# Patient Record
Sex: Female | Born: 2000 | Race: Black or African American | Hispanic: No | Marital: Single | Smoking: Never smoker
Health system: Southern US, Community
[De-identification: ages and names within clinical notes are randomized; demographics above are authoritative.]

## PROBLEM LIST (undated history)

## (undated) DIAGNOSIS — L309 Dermatitis, unspecified: Secondary | ICD-10-CM

## (undated) DIAGNOSIS — J45909 Unspecified asthma, uncomplicated: Secondary | ICD-10-CM

---
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
## 2019-11-28 ENCOUNTER — Emergency Department (HOSPITAL_BASED_OUTPATIENT_CLINIC_OR_DEPARTMENT_OTHER)
Admission: EM | Admit: 2019-11-28 | Discharge: 2019-11-28 | Disposition: A | Discharge status: Home / Self Care | Payer: Managed Care, Other (non HMO) | Attending: Emergency Medicine | Admitting: Emergency Medicine

## 2019-11-28 ENCOUNTER — Encounter (HOSPITAL_BASED_OUTPATIENT_CLINIC_OR_DEPARTMENT_OTHER): Payer: Self-pay | Admitting: Emergency Medicine

## 2019-11-28 ENCOUNTER — Other Ambulatory Visit: Payer: Self-pay

## 2019-11-28 DIAGNOSIS — N72 Inflammatory disease of cervix uteri: Secondary | ICD-10-CM | POA: Insufficient documentation

## 2019-11-28 DIAGNOSIS — J45909 Unspecified asthma, uncomplicated: Secondary | ICD-10-CM | POA: Insufficient documentation

## 2019-11-28 DIAGNOSIS — B9689 Other specified bacterial agents as the cause of diseases classified elsewhere: Secondary | ICD-10-CM

## 2019-11-28 DIAGNOSIS — N760 Acute vaginitis: Secondary | ICD-10-CM | POA: Insufficient documentation

## 2019-11-28 LAB — URINALYSIS, MICROSCOPIC (REFLEX): RBC / HPF: NONE SEEN RBC/hpf (ref 0–5)

## 2019-11-28 LAB — URINALYSIS, ROUTINE W REFLEX MICROSCOPIC
Bilirubin Urine: NEGATIVE
Glucose, UA: NEGATIVE mg/dL
Hgb urine dipstick: NEGATIVE
Ketones, ur: NEGATIVE mg/dL
Nitrite: NEGATIVE
Protein, ur: NEGATIVE mg/dL
Specific Gravity, Urine: 1.025 (ref 1.005–1.030)
pH: 6 (ref 5.0–8.0)

## 2019-11-28 LAB — WET PREP, GENITAL
Sperm: NONE SEEN
Trich, Wet Prep: NONE SEEN

## 2019-11-28 LAB — PREGNANCY, URINE: Preg Test, Ur: NEGATIVE

## 2019-11-28 MED ORDER — CEFTRIAXONE SODIUM 500 MG IJ SOLR: 250.0000 mg | Freq: Once | INTRAMUSCULAR | Status: DC

## 2019-11-28 MED ORDER — DOXYCYCLINE HYCLATE 100 MG PO TABS
100.0000 mg | ORAL_TABLET | Freq: Once | ORAL | Status: AC
  Administered 2019-11-28: 100 mg via ORAL
  Filled 2019-11-28: qty 1

## 2019-11-28 MED ORDER — METRONIDAZOLE 500 MG PO TABS: 500.0000 mg | ORAL_TABLET | Freq: Two times a day (BID) | ORAL | 0 refills | Status: AC

## 2019-11-28 MED ORDER — CEFTRIAXONE SODIUM 500 MG IJ SOLR
500.0000 mg | Freq: Once | INTRAMUSCULAR | Status: AC
  Administered 2019-11-28: 500 mg via INTRAMUSCULAR

## 2019-11-28 MED ORDER — CEFTRIAXONE SODIUM 500 MG IJ SOLR
INTRAMUSCULAR | Status: AC
  Filled 2019-11-28: qty 500

## 2019-11-28 MED ORDER — DOXYCYCLINE HYCLATE 100 MG PO CAPS: 100.0000 mg | ORAL_CAPSULE | Freq: Two times a day (BID) | ORAL | 0 refills | Status: AC

## 2019-11-28 NOTE — ED Triage Notes (Signed)
Reports vaginal discharge , itching, dysuria and hematuria x 4 days. Lower abd pain. Denies nV.

## 2019-11-28 NOTE — ED Provider Notes (Signed)
[facility] EMERGENCY DEPARTMENT Provider Note   CSN: [id number] Arrival date & time: 11/28/19  2229     History Chief Complaint  Patient presents with  . Vaginal Discharge  . Dysuria    Sylvia Olson is a 19 y.o. female.   Vaginal Discharge Quality:  Thick and white Severity:  Moderate Onset quality:  Gradual Timing:  Constant Progression:  Worsening Chronicity:  New Relieved by:  Nothing Worsened by:  Nothing Ineffective treatments:  None tried Associated symptoms: dysuria   Associated symptoms: no fever, no nausea, no urinary hesitancy, no urinary incontinence and no vomiting   Dysuria Associated symptoms: vaginal discharge   Associated symptoms: no fever, no nausea and no vomiting        Past Medical History:  Diagnosis Date  . Asthma   . Eczema     There are no problems to display for this patient.   History reviewed. No pertinent surgical history.   OB History   No obstetric history on file.     No family history on file.  Social History   Tobacco Use  . Smoking status: Never Smoker  Substance Use Topics  . Alcohol use: No  . Drug use: Not on file    Home Medications Prior to Admission medications   Medication Sig Start Date End Date Taking? Authorizing Provider  doxycycline (VIBRAMYCIN) 100 MG capsule Take 1 capsule (100 mg total) by mouth 2 (two) times daily for 7 days. 11/28/19 12/05/19  Sabino Donovan, MD  metroNIDAZOLE (FLAGYL) 500 MG tablet Take 1 tablet (500 mg total) by mouth 2 (two) times daily for 7 days. 11/28/19 12/05/19  Sabino Donovan, MD    Allergies    Patient has no known allergies.  Review of Systems   Review of Systems  Constitutional: Negative for chills and fever.  HENT: Negative for congestion and rhinorrhea.   Respiratory: Negative for cough and shortness of breath.   Cardiovascular: Negative for chest pain and palpitations.  Gastrointestinal: Negative for diarrhea, nausea and vomiting.    Genitourinary: Positive for dysuria, pelvic pain and vaginal discharge. Negative for bladder incontinence, difficulty urinating and hesitancy.  Musculoskeletal: Negative for arthralgias and back pain.  Skin: Negative for rash and wound.  Neurological: Negative for light-headedness and headaches.    Physical Exam Updated Vital Signs BP 132/84   Pulse 84   Temp 98.7 F (37.1 C) (Oral)   Resp 16   Ht 5\' 6"  (1.676 m)   Wt 78.9 kg   LMP 11/13/2019   SpO2 100%   BMI 28.08 kg/m   Physical Exam Vitals and nursing note reviewed. Exam conducted with a chaperone present.  Constitutional:      General: She is not in acute distress.    Appearance: Normal appearance.  HENT:     Head: Normocephalic and atraumatic.     Nose: No rhinorrhea.  Eyes:     General:        Right eye: No discharge.        Left eye: No discharge.     Conjunctiva/sclera: Conjunctivae normal.  Cardiovascular:     Rate and Rhythm: Normal rate and regular rhythm.  Pulmonary:     Effort: Pulmonary effort is normal. No respiratory distress.     Breath sounds: No stridor.  Abdominal:     General: Abdomen is flat. There is no distension.     Palpations: Abdomen is soft.     Tenderness: There is no abdominal tenderness.  Genitourinary:    Labia:        Right: No rash or tenderness.        Left: No rash or tenderness.      Vagina: Vaginal discharge present. No tenderness.     Cervix: Discharge, friability and erythema present. No cervical motion tenderness.  Musculoskeletal:        General: No tenderness or signs of injury.  Skin:    General: Skin is warm and dry.  Neurological:     General: No focal deficit present.     Mental Status: She is alert. Mental status is at baseline.     Motor: No weakness.  Psychiatric:        Mood and Affect: Mood normal.        Behavior: Behavior normal.     ED Results / Procedures / Treatments   Labs (all labs ordered are listed, but only abnormal results are  displayed) Labs Reviewed  WET PREP, GENITAL - Abnormal; Notable for the following components:      Result Value   Yeast Wet Prep HPF POC PRESENT (*)    Clue Cells Wet Prep HPF POC PRESENT (*)    WBC, Wet Prep HPF POC MANY (*)    All other components within normal limits  URINALYSIS, ROUTINE W REFLEX MICROSCOPIC - Abnormal; Notable for the following components:   Leukocytes,Ua TRACE (*)    All other components within normal limits  URINALYSIS, MICROSCOPIC (REFLEX) - Abnormal; Notable for the following components:   Bacteria, UA MANY (*)    All other components within normal limits  PREGNANCY, URINE  GC/CHLAMYDIA PROBE AMP ([place]) NOT AT [facility]    EKG None  Radiology No results found.  Procedures Procedures (including critical care time)  Medications Ordered in ED Medications  doxycycline (VIBRA-TABS) tablet 100 mg (100 mg Oral Given 11/28/19 0928)  cefTRIAXone (ROCEPHIN) injection 500 mg (500 mg Intramuscular Given 11/28/19 9675)    ED Course  I have reviewed the triage vital signs and the nursing notes.  Pertinent labs & imaging results that were available during my care of the patient were reviewed by me and considered in my medical decision making (see chart for details).    MDM Rules/Calculators/A&P                          Concern for cervicitis and UTI, unlikely to be PID, Rocephin given doxycycline given.  Wet prep pending.  Urinalysis pending.  Urine pregnancy pending.  Wet prep shows concerns for bacterial vaginosis Flagyl.  Prescribed.  Urinalysis shows leukocytes, likely secondary to cervicitis versus bacterial vaginosis, she is given prescription for these and told to follow-up with her primary care provider  Final Clinical Impression(s) / ED Diagnoses Final diagnoses:  Cervicitis  Bacterial vaginosis    Rx / DC Orders ED Discharge Orders         Ordered    doxycycline (VIBRAMYCIN) 100 MG capsule  2 times daily        11/28/19 1007     metroNIDAZOLE (FLAGYL) 500 MG tablet  2 times daily        11/28/19 1007           Sabino Donovan, MD 11/28/19 1008

## 2019-11-29 LAB — GC/CHLAMYDIA PROBE AMP (~~LOC~~) NOT AT ARMC
Chlamydia: POSITIVE — AB
Comment: NEGATIVE
Comment: NORMAL
Neisseria Gonorrhea: NEGATIVE

## 2020-02-09 ENCOUNTER — Encounter (HOSPITAL_BASED_OUTPATIENT_CLINIC_OR_DEPARTMENT_OTHER): Payer: Self-pay | Admitting: Emergency Medicine

## 2020-02-09 ENCOUNTER — Other Ambulatory Visit: Payer: Self-pay

## 2020-02-09 DIAGNOSIS — R059 Cough, unspecified: Secondary | ICD-10-CM | POA: Insufficient documentation

## 2020-02-09 DIAGNOSIS — Z1152 Encounter for screening for COVID-19: Secondary | ICD-10-CM | POA: Insufficient documentation

## 2020-02-09 DIAGNOSIS — Z5321 Procedure and treatment not carried out due to patient leaving prior to being seen by health care provider: Secondary | ICD-10-CM | POA: Insufficient documentation

## 2020-02-09 NOTE — ED Triage Notes (Signed)
Cough since yesterday. Known covid exposure.

## 2020-02-10 ENCOUNTER — Emergency Department (HOSPITAL_BASED_OUTPATIENT_CLINIC_OR_DEPARTMENT_OTHER)
Admission: EM | Admit: 2020-02-10 | Discharge: 2020-02-10 | Disposition: A | Discharge status: Home / Self Care | Payer: Medicaid Other | Attending: Emergency Medicine | Admitting: Emergency Medicine

## 2023-08-16 ENCOUNTER — Emergency Department (HOSPITAL_BASED_OUTPATIENT_CLINIC_OR_DEPARTMENT_OTHER): Admission: EM | Admit: 2023-08-16 | Discharge: 2023-08-16 | Disposition: A | Discharge status: Home / Self Care

## 2023-08-16 ENCOUNTER — Other Ambulatory Visit: Payer: Self-pay

## 2023-08-16 ENCOUNTER — Encounter (HOSPITAL_BASED_OUTPATIENT_CLINIC_OR_DEPARTMENT_OTHER): Payer: Self-pay | Admitting: Emergency Medicine

## 2023-08-16 DIAGNOSIS — J45909 Unspecified asthma, uncomplicated: Secondary | ICD-10-CM | POA: Insufficient documentation

## 2023-08-16 DIAGNOSIS — M545 Low back pain, unspecified: Secondary | ICD-10-CM | POA: Diagnosis present

## 2023-08-16 DIAGNOSIS — M5442 Lumbago with sciatica, left side: Secondary | ICD-10-CM | POA: Diagnosis not present

## 2023-08-16 MED ORDER — METHOCARBAMOL 500 MG PO TABS: 500.0000 mg | ORAL_TABLET | Freq: Two times a day (BID) | ORAL | 0 refills | Status: AC

## 2023-08-16 NOTE — ED Triage Notes (Signed)
 Middle to lower back pain since July 3rd.  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  No known fevers.  Pain radiates to both thighs.

## 2023-08-16 NOTE — Discharge Instructions (Signed)
 Please use Tylenol or ibuprofen for pain.  You may use 600 mg ibuprofen every 6 hours or 1000 mg of Tylenol every 6 hours.  You may choose to alternate between the 2.  This would be most effective.  Not to exceed 4 g of Tylenol within 24 hours.  Not to exceed 3200 mg ibuprofen 24 hours.  You can use the muscle relaxant I am prescribing in addition to the above to help with any breakthrough pain.  You can take it up to twice daily.  It is safe to take at night, but I would be cautious taking it during the day as it can cause some drowsiness.  Make sure that you are feeling awake and alert before you get behind the wheel of a car or operate a motor vehicle.  It is not a narcotic pain medication so you are able to take it if it is not making you drowsy and still pilot a vehicle or machinery safely.  Follow-up with the orthopedic physician if you do not significant proved of your symptoms after 1 to 2 weeks of treatment.

## 2023-08-16 NOTE — ED Provider Notes (Signed)
 [place] [facility]ERGENCY DEPARTMENT AT [facility] Provider Note   CSN: [id number] Arrival date & time: 08/16/23  8182     Patient presents with: Back Pain   Sylvia Olson is a 23 y.o. female with past medical history significant for asthma, eczema who presents concern for lower back pain since July 3.  She reports that she stands a lot as part of her job at [employer] any injury.  No history of IV drug use, steroid use, or history of cancer.  No known fever.  Pain radiates to both thighs.  Down left leg.  Has been taking some ibuprofen without significant relief.  No history of trauma.  Denies any numbness in groin, loss of control of bladder, bowels.    Back Pain      Prior to Admission medications   Medication Sig Start Date End Date Taking? Authorizing Provider  methocarbamol  (ROBAXIN ) 500 MG tablet Take 1 tablet (500 mg total) by mouth 2 (two) times daily. 08/16/23  Yes Blase Beckner H, PA-C    Allergies: Patient has no known allergies.    Review of Systems  Musculoskeletal:  Positive for back pain.  All other systems reviewed and are negative.   Updated Vital Signs BP (!) 144/90 (BP Location: Left Arm)   Pulse 67   Temp 98.4 F (36.9 C) (Oral)   Resp 16   Ht 5' 6 (1.676 m)   Wt 95.3 kg   LMP 07/27/2023   SpO2 100%   BMI 33.89 kg/m   Physical Exam Vitals and nursing note reviewed.  Constitutional:      General: She is not in acute distress.    Appearance: Normal appearance.  HENT:     Head: Normocephalic and atraumatic.  Eyes:     General:        Right eye: No discharge.        Left eye: No discharge.  Cardiovascular:     Rate and Rhythm: Normal rate and regular rhythm.     Pulses: Normal pulses.     Heart sounds: No murmur heard.    No friction rub. No gallop.  Pulmonary:     Effort: Pulmonary effort is normal.     Breath sounds: Normal breath sounds.  Abdominal:     General: Bowel sounds are normal.     Palpations: Abdomen is soft.   Musculoskeletal:     Comments: Patient with some focal tenderness to palpation in lumbar paraspinous muscles, left greater than right.  Positive straight leg raise on left.  Intact strength 5/5 bilateral lower extremities, patient can ambulate without difficulty.  No step-off or deformity to midline spine.  No significant tenderness to palpation throughout midline cervical and thoracic spine.  Skin:    General: Skin is warm and dry.     Capillary Refill: Capillary refill takes less than 2 seconds.  Neurological:     Mental Status: She is alert and oriented to person, place, and time.  Psychiatric:        Mood and Affect: Mood normal.        Behavior: Behavior normal.     (all labs ordered are listed, but only abnormal results are displayed) Labs Reviewed - No data to display  EKG: None  Radiology: No results found.   Procedures   Medications Ordered in the ED - No data to display  Medical Decision Making  Patient with back pain.  My emergent differential diagnosis includes slipped disc, compression fracture, spondylolisthesis, less clinical concern for epidural abscess or osteomyelitis based on patient history.  Overall with high clinical suspicion for lumbar sprain, sciatica based on clinical presentation, risk factors.  No neurological deficits. Patient is ambulatory. No warning symptoms of back pain including: fecal incontinence, urinary retention or overflow incontinence, night sweats, waking from sleep with back pain, unexplained fevers or weight loss, h/o cancer, IVDU, recent trauma. Overall low clinical concern for cauda equina, epidural abscess, or other serious cause of back pain.  Given this work-up, evaluation, physical exam I do not believe that radiographic imaging is indicated at this time.  Conservative measures such as rest, ice/heat, ibuprofen, Tylenol, and  prescription for Robaxin  indicated with orthopedic follow-up if no  improvement with conservative management.  Extensive return precautions given, patient discharged in stable condition at this time.   Final diagnoses:  Acute bilateral low back pain with left-sided sciatica    ED Discharge Orders          Ordered    methocarbamol  (ROBAXIN ) 500 MG tablet  2 times daily        08/16/23 1930               Rosan Sherlean VEAR DEVONNA 08/16/23 1930    Simon Lavonia SAILOR, MD 08/16/23 2330
